# Patient Record
Sex: Female | Born: 1955 | Race: White | Hispanic: No | Marital: Single | Smoking: Never smoker
Health system: Southern US, Community
[De-identification: ages and names within clinical notes are randomized; demographics above are authoritative.]

---

## 2013-05-13 DEATH — deceased

## 2021-02-22 ENCOUNTER — Ambulatory Visit
Admission: EM | Admit: 2021-02-22 | Discharge: 2021-02-22 | Disposition: A | Discharge status: Home / Self Care | Payer: Self-pay | Attending: Physician Assistant | Admitting: Physician Assistant

## 2021-02-22 ENCOUNTER — Ambulatory Visit (INDEPENDENT_AMBULATORY_CARE_PROVIDER_SITE_OTHER): Payer: Self-pay

## 2021-02-22 ENCOUNTER — Other Ambulatory Visit: Payer: Self-pay

## 2021-02-22 DIAGNOSIS — S92015A Nondisplaced fracture of body of left calcaneus, initial encounter for closed fracture: Secondary | ICD-10-CM

## 2021-02-22 DIAGNOSIS — S92002A Unspecified fracture of left calcaneus, initial encounter for closed fracture: Secondary | ICD-10-CM

## 2021-02-22 NOTE — ED Provider Notes (Signed)
[facility]    CSN: [id number] Arrival date & time: 02/22/21  0947      History   Chief Complaint Chief Complaint  Patient presents with   Foot Pain    left    HPI Manon Banbury is a 65 y.o. female.   Patient here today for evaluation of left foot and ankle pain that started after she twisted her foot falling down 2 steps yesterday. She landed on her buttocks but does not complain of any pain to the buttock area. She reports that weight bearing worsens pain. She has tried ibuprofen with mild relief. She has had some numbness to her toes at times.   The history is provided by the patient.  Foot Pain Pertinent negatives include no abdominal pain.   History reviewed. No pertinent past medical history.  There are no problems to display for this patient.   History reviewed. No pertinent surgical history.  OB History   No obstetric history on file.      Home Medications    Prior to Admission medications   Not on File    Family History History reviewed. No pertinent family history.  Social History Social History   Tobacco Use   Smoking status: Never   Smokeless tobacco: Never     Allergies   Penicillins   Review of Systems Review of Systems  Constitutional:  Negative for chills and fever.  Eyes:  Negative for discharge and redness.  Gastrointestinal:  Negative for abdominal pain, nausea and vomiting.  Genitourinary:  Positive for vaginal bleeding and vaginal discharge.  Musculoskeletal:  Positive for arthralgias and joint swelling.  Skin:  Positive for color change. Negative for wound.  Neurological:  Positive for numbness.    Physical Exam Triage Vital Signs ED Triage Vitals  Enc Vitals Group     BP 02/22/21 1146 (!) 142/81     Pulse Rate 02/22/21 1146 98     Resp 02/22/21 1146 18     Temp 02/22/21 1146 97.9 F (36.6 C)     Temp Source 02/22/21 1146 Oral     SpO2 02/22/21 1146 98 %     Weight --      Height --      Head  Circumference --      Peak Flow --      Pain Score 02/22/21 1150 9     Pain Loc --      Pain Edu? --      Excl. in GC? --    No data found.  Updated Vital Signs BP (!) 142/81 (BP Location: Left Arm)   Pulse 98   Temp 97.9 F (36.6 C) (Oral)   Resp 18   SpO2 98%       Physical Exam Vitals and nursing note reviewed.  Constitutional:      General: She is not in acute distress.    Appearance: Normal appearance. She is not ill-appearing.  HENT:     Head: Normocephalic and atraumatic.  Eyes:     Conjunctiva/sclera: Conjunctivae normal.  Cardiovascular:     Rate and Rhythm: Normal rate.  Pulmonary:     Effort: Pulmonary effort is normal.  Musculoskeletal:     Comments: Full ROM of left ankle, swelling present to ankle diffusely. Bruising noted to lateral left forefoot, TTP noted to same.   Skin:    Capillary Refill: Brisk cap refill to left toes Neurological:     Mental Status: She is alert.  Comments: Gross sensation intact to left toes  Psychiatric:        Mood and Affect: Mood normal.        Behavior: Behavior normal.        Thought Content: Thought content normal.     UC Treatments / Results  Labs (all labs ordered are listed, but only abnormal results are displayed) Labs Reviewed - No data to display  EKG   Radiology DG Ankle Complete Left  Result Date: 02/22/2021 CLINICAL DATA:  Fall with left foot and ankle pain and swelling EXAM: LEFT ANKLE COMPLETE - 3+ VIEW; LEFT FOOT - COMPLETE 3+ VIEW COMPARISON:  None. FINDINGS: Suspected subtle cortical avulsion fracture along the anterolateral aspect of the distal calcaneus seen only on AP view of the ankle. Remaining osseous structures appear intact. No additional fractures. Ankle mortise is congruent. Pes cavus alignment. Mild first MTP joint osteoarthritis. Soft tissue swelling at the lateral aspect of the hindfoot. IMPRESSION: 1. Suspected subtle cortical avulsion fracture along the anterolateral aspect of the  distal calcaneus. Appearance suggestive of extensor digitorum brevis avulsion injury. Correlate with point tenderness. 2. Soft tissue swelling at the lateral aspect of the hindfoot. Electronically Signed   By: Duanne Guess D.O.   On: 02/22/2021 12:28   DG Foot Complete Left  Result Date: 02/22/2021 CLINICAL DATA:  Fall with left foot and ankle pain and swelling EXAM: LEFT ANKLE COMPLETE - 3+ VIEW; LEFT FOOT - COMPLETE 3+ VIEW COMPARISON:  None. FINDINGS: Suspected subtle cortical avulsion fracture along the anterolateral aspect of the distal calcaneus seen only on AP view of the ankle. Remaining osseous structures appear intact. No additional fractures. Ankle mortise is congruent. Pes cavus alignment. Mild first MTP joint osteoarthritis. Soft tissue swelling at the lateral aspect of the hindfoot. IMPRESSION: 1. Suspected subtle cortical avulsion fracture along the anterolateral aspect of the distal calcaneus. Appearance suggestive of extensor digitorum brevis avulsion injury. Correlate with point tenderness. 2. Soft tissue swelling at the lateral aspect of the hindfoot. Electronically Signed   By: Duanne Guess D.O.   On: 02/22/2021 12:28    Procedures Procedures (including critical care time)  Medications Ordered in UC Medications - No data to display  Initial Impression / Assessment and Plan / UC Course  I have reviewed the triage vital signs and the nursing notes.  Pertinent labs & imaging results that were available during my care of the patient were reviewed by me and considered in my medical decision making (see chart for details).    Possible avulsion fracture to left calcaneus. Recommended further evaluation by ortho first thing tomorrow. Ok to continue ibuprofen if needed.  Advised no further weight bearing, post op boot and crutches provided. Patient expresses understanding.   Final Clinical Impressions(s) / UC Diagnoses   Final diagnoses:  Closed nondisplaced fracture of  left calcaneus, unspecified portion of calcaneus, initial encounter   Discharge Instructions   None    ED Prescriptions   None    PDMP not reviewed this encounter.   Tomi Bamberger, PA-C 02/22/21 1333

## 2021-02-22 NOTE — ED Triage Notes (Signed)
Pt fell down the steps (2 steps) yesterday, landing on her bottom causing a sudden onset of left foot and ankle pain. Confirms swelling and bruising. Has been taking ibuprofen, elevating the foot, cold compress and wearing an ace wrap with some relief. Ambulating with a limp due to pain. No LOC.

## 2022-11-25 IMAGING — DX DG ANKLE COMPLETE 3+V*L*
3 series · 3 of 3 positions shown · non-contrast
Comparison: None.

CLINICAL DATA: Fall with left foot and ankle pain and swelling

EXAM:
LEFT ANKLE COMPLETE - 3+ VIEW; LEFT FOOT - COMPLETE 3+ VIEW

[ankle ap]
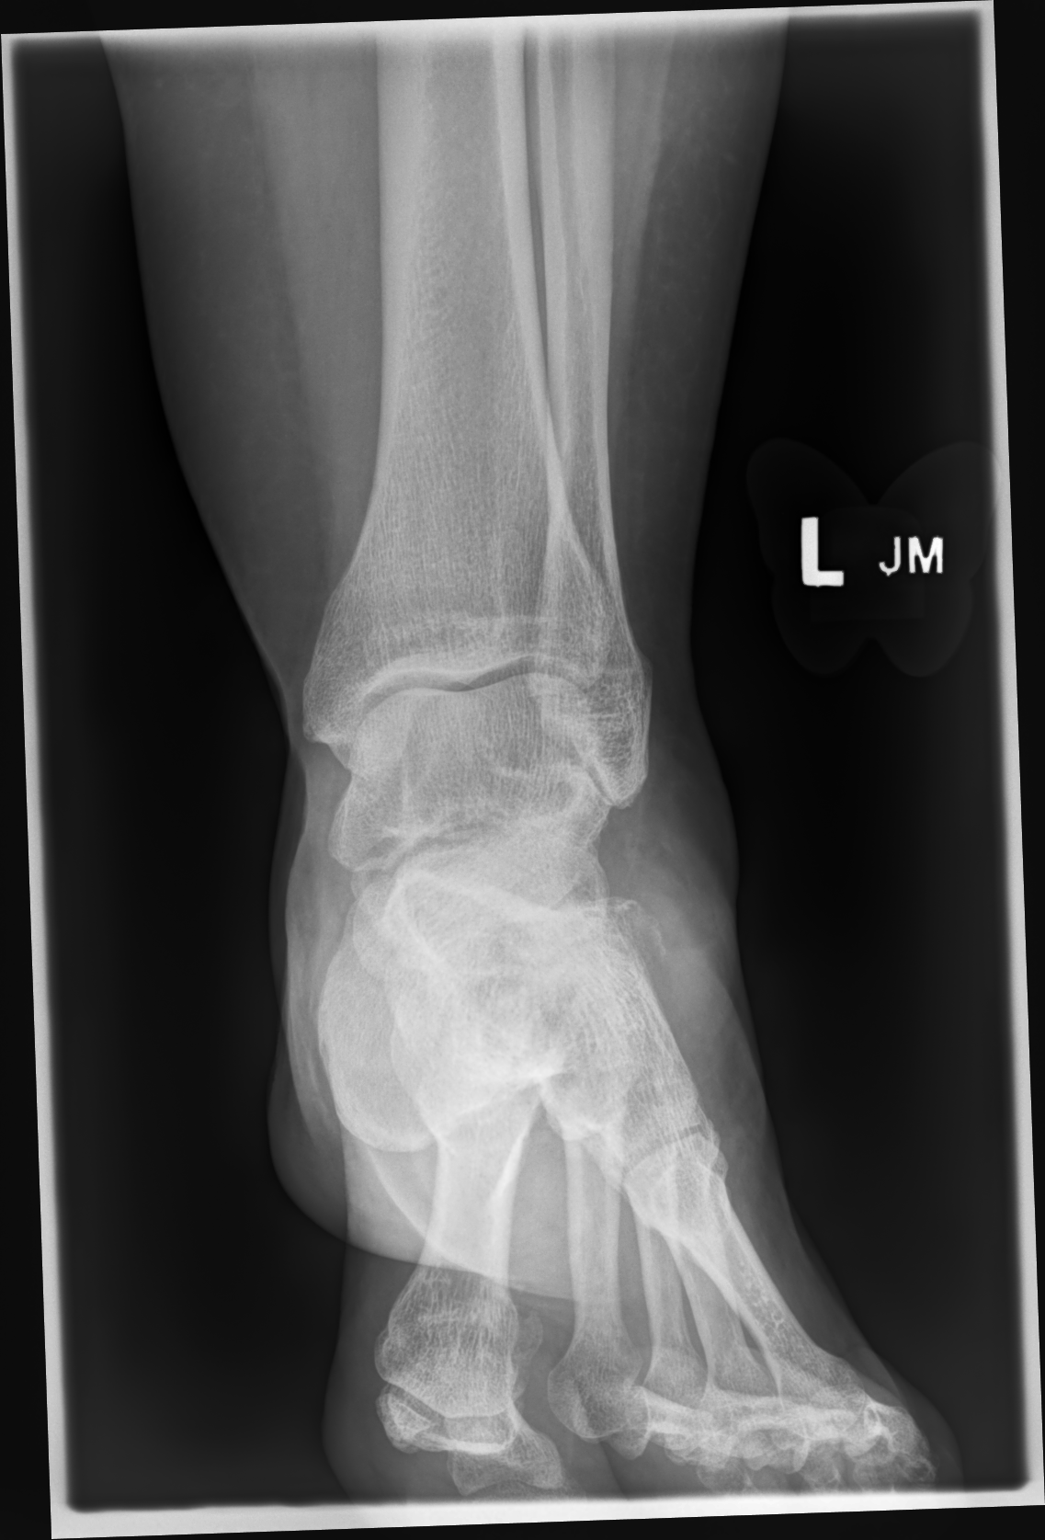

[ankle medial oblique]
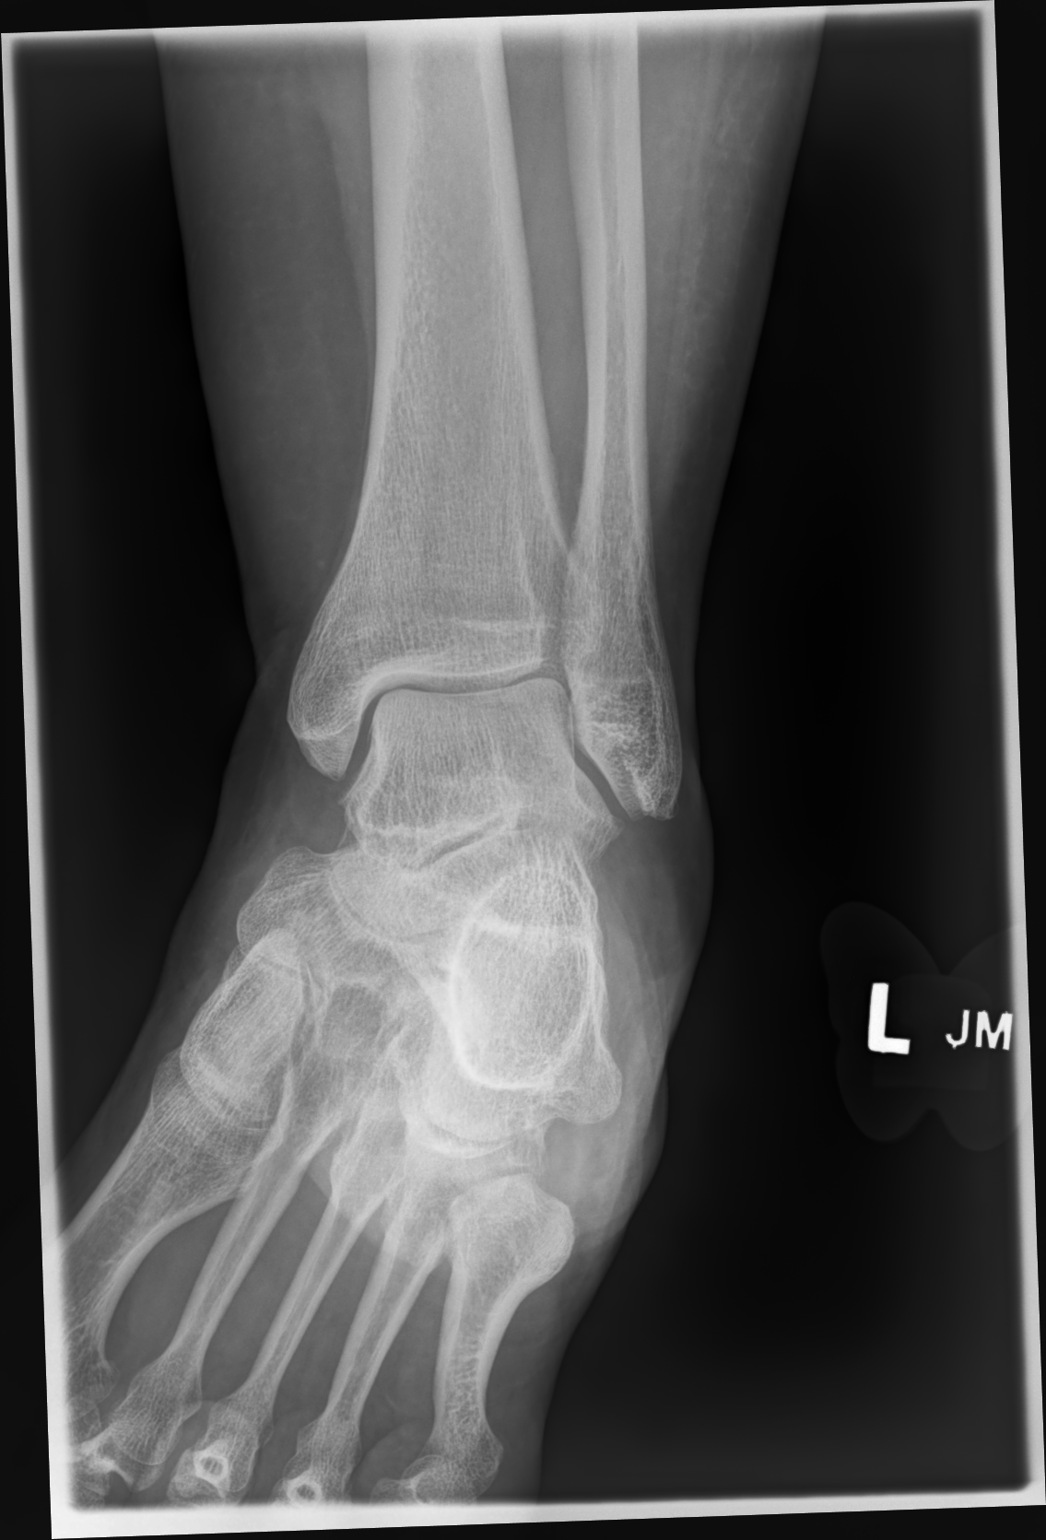

[ankle lat]
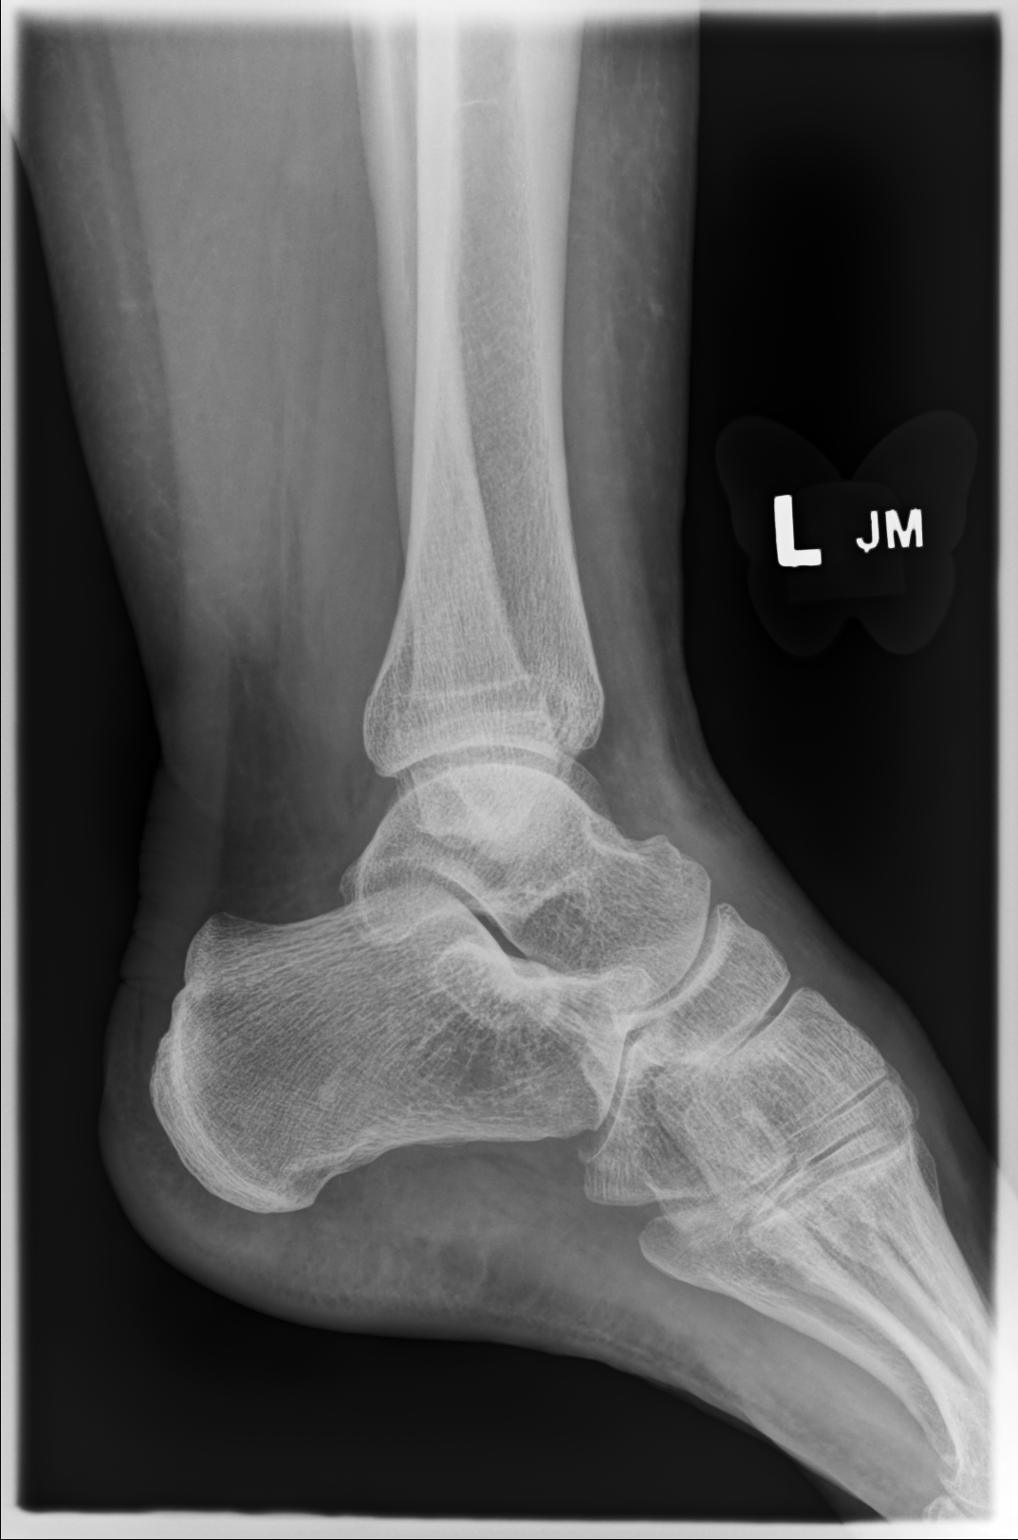

[3 of 3 positions shown; findings below may reference images not displayed]

FINDINGS: Suspected subtle cortical avulsion fracture along the anterolateral
aspect of the distal calcaneus seen only on AP view of the ankle.
Remaining osseous structures appear intact. No additional fractures.
Ankle mortise is congruent. Fenesi Kati alignment. Mild first MTP
joint osteoarthritis. Soft tissue swelling at the lateral aspect of
the hindfoot.
IMPRESSION: 1. Suspected subtle cortical avulsion fracture along the
anterolateral aspect of the distal calcaneus. Appearance suggestive
of extensor digitorum brevis avulsion injury. Correlate with point
tenderness.
2. Soft tissue swelling at the lateral aspect of the hindfoot.

## 2022-11-25 IMAGING — DX DG FOOT COMPLETE 3+V*L*
3 series · 3 of 3 positions shown · non-contrast
Comparison: None.

CLINICAL DATA: Fall with left foot and ankle pain and swelling

EXAM:
LEFT ANKLE COMPLETE - 3+ VIEW; LEFT FOOT - COMPLETE 3+ VIEW

[foot supine dp]
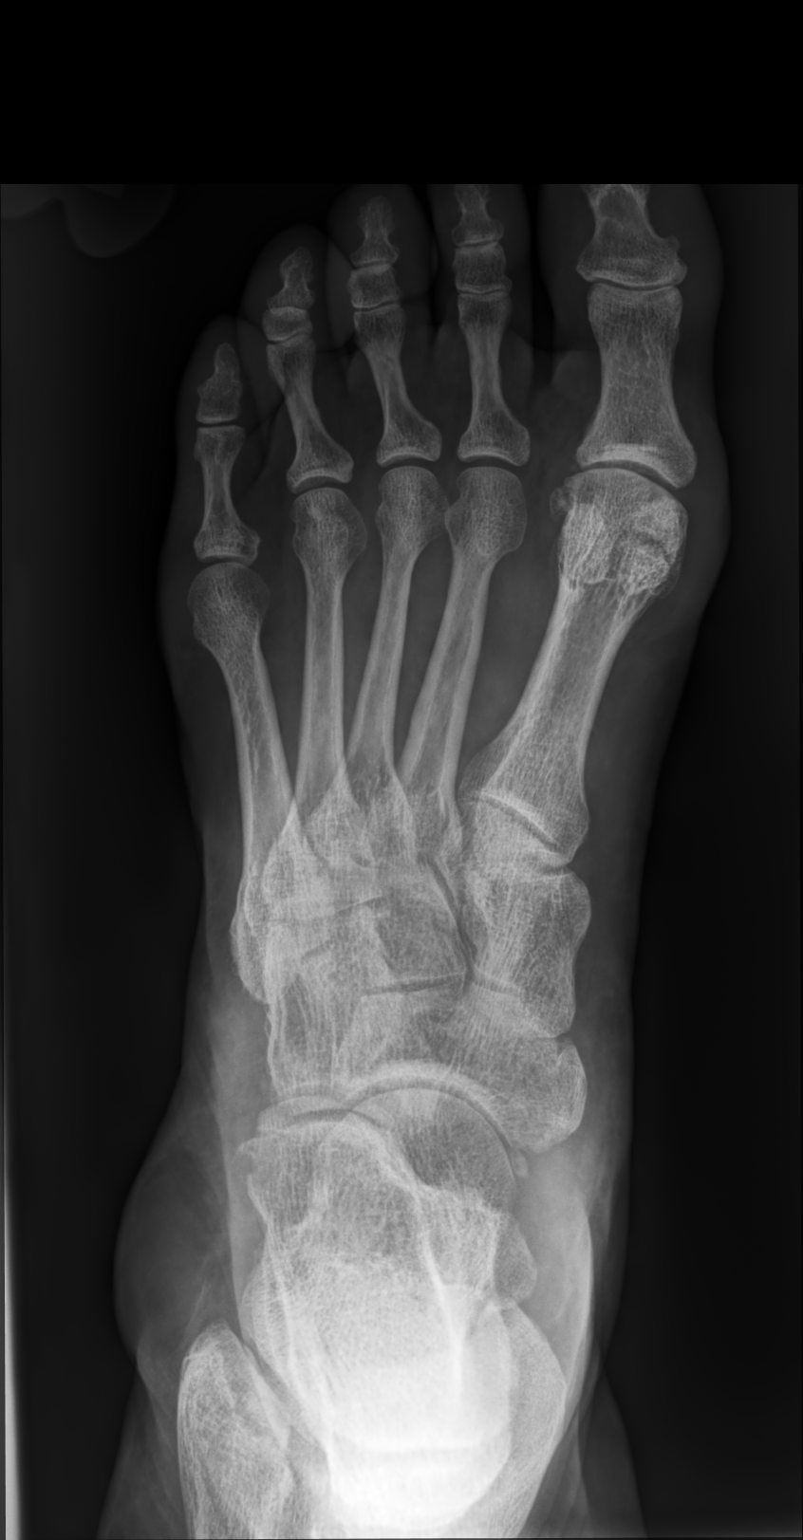

[foot medial oblique]
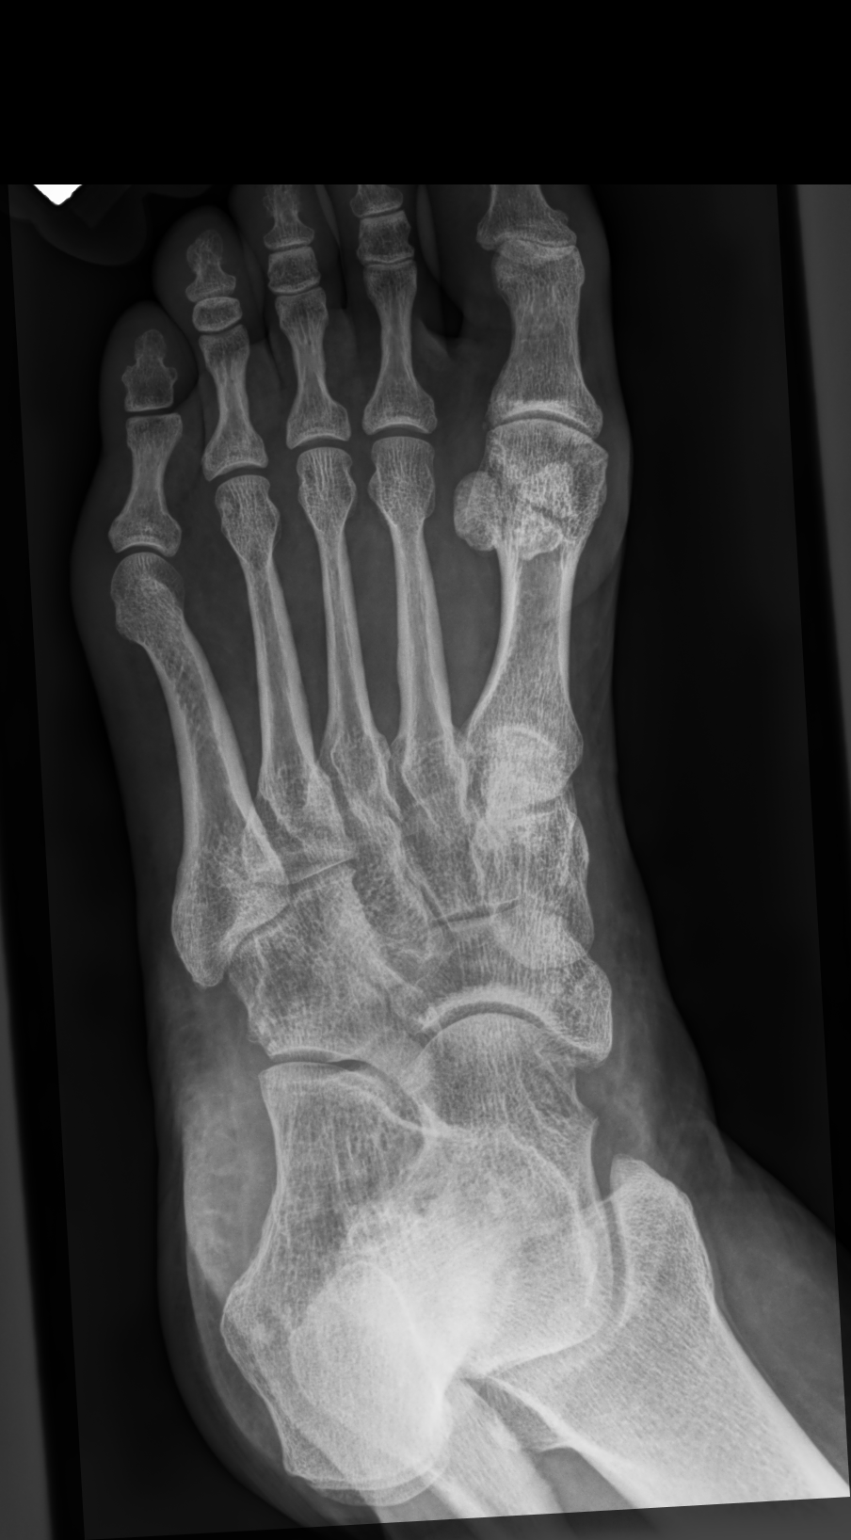

[foot supine lat]
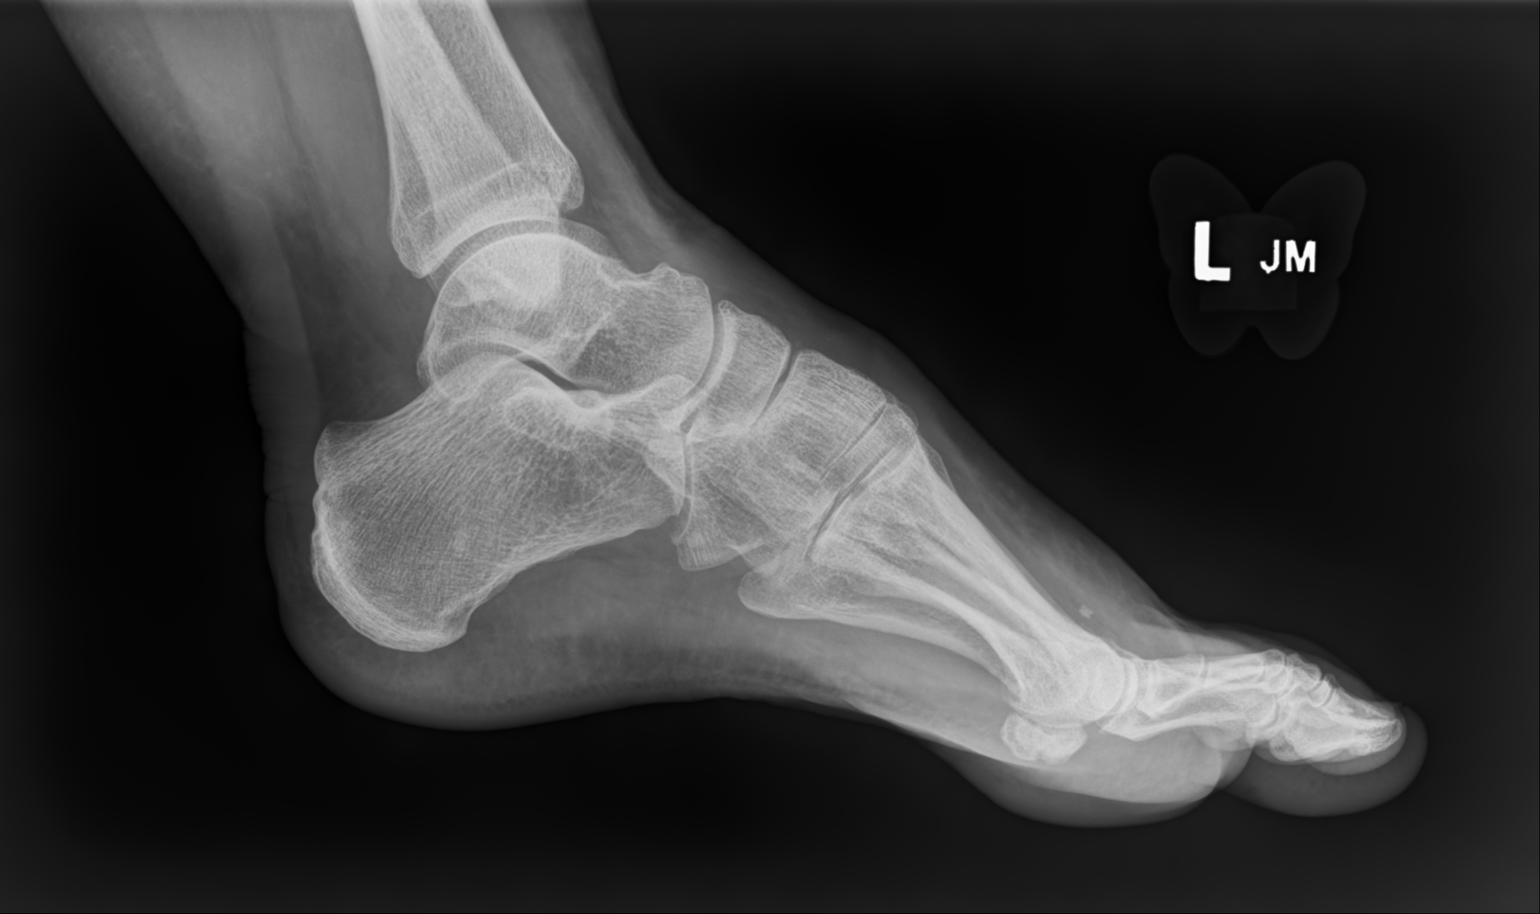

[3 of 3 positions shown; findings below may reference images not displayed]

FINDINGS: Suspected subtle cortical avulsion fracture along the anterolateral
aspect of the distal calcaneus seen only on AP view of the ankle.
Remaining osseous structures appear intact. No additional fractures.
Ankle mortise is congruent. Fenesi Kati alignment. Mild first MTP
joint osteoarthritis. Soft tissue swelling at the lateral aspect of
the hindfoot.
IMPRESSION: 1. Suspected subtle cortical avulsion fracture along the
anterolateral aspect of the distal calcaneus. Appearance suggestive
of extensor digitorum brevis avulsion injury. Correlate with point
tenderness.
2. Soft tissue swelling at the lateral aspect of the hindfoot.
